# Patient Record
Sex: Female | Born: 1995 | Race: White | Hispanic: No | Marital: Single | Smoking: Current every day smoker
Health system: Southern US, Community
[De-identification: ages and names within clinical notes are randomized; demographics above are authoritative.]

## PROBLEM LIST (undated history)

## (undated) ENCOUNTER — Inpatient Hospital Stay (HOSPITAL_COMMUNITY): Payer: Self-pay

## (undated) DIAGNOSIS — F1910 Other psychoactive substance abuse, uncomplicated: Secondary | ICD-10-CM

## (undated) DIAGNOSIS — Z789 Other specified health status: Secondary | ICD-10-CM

## (undated) DIAGNOSIS — K8020 Calculus of gallbladder without cholecystitis without obstruction: Secondary | ICD-10-CM

---
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
## 2013-02-24 ENCOUNTER — Encounter (HOSPITAL_COMMUNITY): Payer: Self-pay | Admitting: *Deleted

## 2013-12-15 ENCOUNTER — Encounter (HOSPITAL_COMMUNITY): Payer: Self-pay | Admitting: *Deleted

## 2014-06-21 ENCOUNTER — Emergency Department (HOSPITAL_COMMUNITY): Payer: Self-pay

## 2014-06-21 ENCOUNTER — Emergency Department (HOSPITAL_COMMUNITY): Payer: Medicaid Other

## 2014-06-21 ENCOUNTER — Emergency Department (HOSPITAL_COMMUNITY)
Admission: EM | Admit: 2014-06-21 | Discharge: 2014-06-21 | Disposition: A | Discharge status: Home / Self Care | Payer: Self-pay | Attending: Emergency Medicine | Admitting: Emergency Medicine

## 2014-06-21 ENCOUNTER — Encounter (HOSPITAL_COMMUNITY): Payer: Self-pay | Admitting: Emergency Medicine

## 2014-06-21 DIAGNOSIS — R109 Unspecified abdominal pain: Secondary | ICD-10-CM

## 2014-06-21 DIAGNOSIS — R112 Nausea with vomiting, unspecified: Secondary | ICD-10-CM

## 2014-06-21 DIAGNOSIS — O99331 Smoking (tobacco) complicating pregnancy, first trimester: Secondary | ICD-10-CM | POA: Insufficient documentation

## 2014-06-21 DIAGNOSIS — R1013 Epigastric pain: Secondary | ICD-10-CM | POA: Insufficient documentation

## 2014-06-21 DIAGNOSIS — Z5900 Homelessness unspecified: Secondary | ICD-10-CM

## 2014-06-21 DIAGNOSIS — B9689 Other specified bacterial agents as the cause of diseases classified elsewhere: Secondary | ICD-10-CM

## 2014-06-21 DIAGNOSIS — Z3490 Encounter for supervision of normal pregnancy, unspecified, unspecified trimester: Secondary | ICD-10-CM

## 2014-06-21 DIAGNOSIS — R52 Pain, unspecified: Secondary | ICD-10-CM

## 2014-06-21 DIAGNOSIS — F1910 Other psychoactive substance abuse, uncomplicated: Secondary | ICD-10-CM

## 2014-06-21 DIAGNOSIS — Z3A01 Less than 8 weeks gestation of pregnancy: Secondary | ICD-10-CM | POA: Insufficient documentation

## 2014-06-21 DIAGNOSIS — L03012 Cellulitis of left finger: Secondary | ICD-10-CM

## 2014-06-21 DIAGNOSIS — O2391 Unspecified genitourinary tract infection in pregnancy, first trimester: Secondary | ICD-10-CM | POA: Insufficient documentation

## 2014-06-21 DIAGNOSIS — O99711 Diseases of the skin and subcutaneous tissue complicating pregnancy, first trimester: Secondary | ICD-10-CM | POA: Insufficient documentation

## 2014-06-21 DIAGNOSIS — O23511 Infections of cervix in pregnancy, first trimester: Secondary | ICD-10-CM | POA: Insufficient documentation

## 2014-06-21 DIAGNOSIS — O99611 Diseases of the digestive system complicating pregnancy, first trimester: Secondary | ICD-10-CM | POA: Insufficient documentation

## 2014-06-21 DIAGNOSIS — O9989 Other specified diseases and conditions complicating pregnancy, childbirth and the puerperium: Secondary | ICD-10-CM | POA: Insufficient documentation

## 2014-06-21 DIAGNOSIS — R000 Tachycardia, unspecified: Secondary | ICD-10-CM | POA: Insufficient documentation

## 2014-06-21 DIAGNOSIS — O210 Mild hyperemesis gravidarum: Secondary | ICD-10-CM | POA: Insufficient documentation

## 2014-06-21 DIAGNOSIS — K8050 Calculus of bile duct without cholangitis or cholecystitis without obstruction: Secondary | ICD-10-CM

## 2014-06-21 DIAGNOSIS — N72 Inflammatory disease of cervix uteri: Secondary | ICD-10-CM

## 2014-06-21 DIAGNOSIS — Z720 Tobacco use: Secondary | ICD-10-CM

## 2014-06-21 DIAGNOSIS — N760 Acute vaginitis: Secondary | ICD-10-CM

## 2014-06-21 DIAGNOSIS — F1990 Other psychoactive substance use, unspecified, uncomplicated: Secondary | ICD-10-CM

## 2014-06-21 DIAGNOSIS — F17210 Nicotine dependence, cigarettes, uncomplicated: Secondary | ICD-10-CM | POA: Insufficient documentation

## 2014-06-21 HISTORY — DX: Other psychoactive substance abuse, uncomplicated: F19.10

## 2014-06-21 LAB — COMPREHENSIVE METABOLIC PANEL
ALBUMIN: 4.3 g/dL (ref 3.5–5.0)
ALT: 37 U/L (ref 14–54)
ANION GAP: 11 (ref 5–15)
AST: 29 U/L (ref 15–41)
Alkaline Phosphatase: 47 U/L (ref 38–126)
BILIRUBIN TOTAL: 0.7 mg/dL (ref 0.3–1.2)
BUN: 6 mg/dL (ref 6–20)
CALCIUM: 9.8 mg/dL (ref 8.9–10.3)
CO2: 20 mmol/L — ABNORMAL LOW (ref 22–32)
Chloride: 103 mmol/L (ref 101–111)
Creatinine, Ser: 0.54 mg/dL (ref 0.44–1.00)
Glucose, Bld: 96 mg/dL (ref 70–99)
POTASSIUM: 3.7 mmol/L (ref 3.5–5.1)
SODIUM: 134 mmol/L — AB (ref 135–145)
TOTAL PROTEIN: 8.6 g/dL — AB (ref 6.5–8.1)

## 2014-06-21 LAB — CBC WITH DIFFERENTIAL/PLATELET
BASOS ABS: 0 10*3/uL (ref 0.0–0.1)
Basophils Relative: 0 % (ref 0–1)
EOS ABS: 0.1 10*3/uL (ref 0.0–0.7)
Eosinophils Relative: 1 % (ref 0–5)
HCT: 43.6 % (ref 36.0–46.0)
Hemoglobin: 15.4 g/dL — ABNORMAL HIGH (ref 12.0–15.0)
LYMPHS ABS: 3 10*3/uL (ref 0.7–4.0)
Lymphocytes Relative: 26 % (ref 12–46)
MCH: 30 pg (ref 26.0–34.0)
MCHC: 35.3 g/dL (ref 30.0–36.0)
MCV: 84.8 fL (ref 78.0–100.0)
Monocytes Absolute: 0.7 10*3/uL (ref 0.1–1.0)
Monocytes Relative: 6 % (ref 3–12)
NEUTROS PCT: 67 % (ref 43–77)
Neutro Abs: 7.6 10*3/uL (ref 1.7–7.7)
PLATELETS: 265 10*3/uL (ref 150–400)
RBC: 5.14 MIL/uL — AB (ref 3.87–5.11)
RDW: 13.4 % (ref 11.5–15.5)
WBC: 11.4 10*3/uL — AB (ref 4.0–10.5)

## 2014-06-21 LAB — URINALYSIS, ROUTINE W REFLEX MICROSCOPIC
Glucose, UA: NEGATIVE mg/dL
KETONES UR: 15 mg/dL — AB
NITRITE: POSITIVE — AB
PH: 7 (ref 5.0–8.0)
Protein, ur: 30 mg/dL — AB
SPECIFIC GRAVITY, URINE: 1.023 (ref 1.005–1.030)
Urobilinogen, UA: 1 mg/dL (ref 0.0–1.0)

## 2014-06-21 LAB — WET PREP, GENITAL
Trich, Wet Prep: NONE SEEN
YEAST WET PREP: NONE SEEN

## 2014-06-21 LAB — RAPID HIV SCREEN (HIV 1/2 AB+AG)
HIV 1/2 Antibodies: NONREACTIVE
HIV-1 P24 ANTIGEN - HIV24: NONREACTIVE

## 2014-06-21 LAB — URINE MICROSCOPIC-ADD ON

## 2014-06-21 LAB — HEPATITIS PANEL, ACUTE
HCV AB: REACTIVE — AB
HEP A IGM: NONREACTIVE
Hep B C IgM: NONREACTIVE
Hepatitis B Surface Ag: NEGATIVE

## 2014-06-21 LAB — I-STAT BETA HCG BLOOD, ED (MC, WL, AP ONLY): I-stat hCG, quantitative: >2000 m[IU]/mL — ABNORMAL HIGH (ref <5)

## 2014-06-21 LAB — HCG, QUANTITATIVE, PREGNANCY: hCG, Beta Chain, Quant, S: 36814 m[IU]/mL — ABNORMAL HIGH (ref <5)

## 2014-06-21 LAB — LIPASE, BLOOD: LIPASE: 88 U/L — AB (ref 22–51)

## 2014-06-21 MED ORDER — DEXTROSE 5 % IV SOLN
1.0000 g | Freq: Once | INTRAVENOUS | Status: AC
  Administered 2014-06-21: 1 g via INTRAVENOUS
  Filled 2014-06-21: qty 10

## 2014-06-21 MED ORDER — METRONIDAZOLE 500 MG PO TABS: 500.0000 mg | ORAL_TABLET | Freq: Two times a day (BID) | ORAL | Status: AC

## 2014-06-21 MED ORDER — MORPHINE SULFATE 4 MG/ML IJ SOLN
4.0000 mg | Freq: Once | INTRAMUSCULAR | Status: AC
  Administered 2014-06-21: 4 mg via INTRAVENOUS
  Filled 2014-06-21: qty 1

## 2014-06-21 MED ORDER — ONDANSETRON HCL 4 MG/2ML IJ SOLN
4.0000 mg | Freq: Once | INTRAMUSCULAR | Status: AC
  Administered 2014-06-21: 4 mg via INTRAVENOUS
  Filled 2014-06-21: qty 2

## 2014-06-21 MED ORDER — METRONIDAZOLE 500 MG PO TABS
500.0000 mg | ORAL_TABLET | Freq: Once | ORAL | Status: AC
  Administered 2014-06-21: 500 mg via ORAL
  Filled 2014-06-21: qty 1

## 2014-06-21 MED ORDER — PRENATAL COMPLETE 14-0.4 MG PO TABS: 1.0000 | ORAL_TABLET | Freq: Every day | ORAL | Status: AC

## 2014-06-21 MED ORDER — CEPHALEXIN 500 MG PO CAPS: 500.0000 mg | ORAL_CAPSULE | Freq: Four times a day (QID) | ORAL | Status: AC

## 2014-06-21 MED ORDER — AZITHROMYCIN 250 MG PO TABS
1000.0000 mg | ORAL_TABLET | Freq: Once | ORAL | Status: AC
  Administered 2014-06-21: 1000 mg via ORAL
  Filled 2014-06-21: qty 4

## 2014-06-21 MED ORDER — SODIUM CHLORIDE 0.9 % IV BOLUS (SEPSIS)
1000.0000 mL | Freq: Once | INTRAVENOUS | Status: AC
  Administered 2014-06-21: 1000 mL via INTRAVENOUS

## 2014-06-21 NOTE — ED Notes (Signed)
Number to call lab results to--- [phone]

## 2014-06-21 NOTE — ED Provider Notes (Signed)
CSN: [id number]     Arrival date & time 06/21/14  1206 History   First MD Initiated Contact with Patient 06/21/14 1211     Chief Complaint  Patient presents with  . Abdominal Pain     (Consider location/radiation/quality/duration/timing/severity/associated sxs/prior Treatment) HPI  Lynn Everett is a 19 y.o. female with past medical history significant for IV heroin and methamphetamine use complaining of bilious emesis 10 episodes onset this morning. She also reports 8 out of 10 bilateral upper quadrant abdominal pain also onset this morning. It is associated with low back pain. Patient denies fever, chills, chest pain, shortness of breath, headache, cervicalgia. She states that she had a good bowel movement this morning but that she's been constipated for 2 weeks. Patient was diagnosed with gallstones 2 years ago, states she does not typically get biliary colic.  Past Medical History  Diagnosis Date  . Gall stones   . Medical history non-contributory   . Substance abuse    History reviewed. No pertinent past surgical history. No family history on file. History  Substance Use Topics  . Smoking status: Current Every Day Smoker -- 1.00 packs/day    Types: Cigarettes  . Smokeless tobacco: Not on file  . Alcohol Use: No   OB History    Gravida Para Term Preterm AB TAB SAB Ectopic Multiple Living   1              Review of Systems  10 systems reviewed and found to be negative, except as noted in the HPI.  Allergies  Codeine  Home Medications   Prior to Admission medications   Medication Sig Start Date End Date Taking? Authorizing Provider  cephALEXin (KEFLEX) 500 MG capsule Take 1 capsule (500 mg total) by mouth 4 (four) times daily. 06/21/14   Lynn Sutcliffe, PA-C  metroNIDAZOLE (FLAGYL) 500 MG tablet Take 1 tablet (500 mg total) by mouth 2 (two) times daily. One tab PO bid x 10 days 06/21/14   Lynn EmeryNicole Christophr Calix, PA-C  Prenatal Vit-Fe Fumarate-FA (PRENATAL COMPLETE) 14-0.4 MG  TABS Take 1 tablet by mouth daily. 06/21/14   Lynn Fornwalt, PA-C   BP 105/51 mmHg  Pulse 108  Temp(Src) 97.7 F (36.5 C) (Oral)  Resp 19  Ht 5' (1.524 m)  Wt 155 lb (70.308 kg)  BMI 30.27 kg/m2  SpO2 100%  LMP 04/21/2014 Physical Exam  Constitutional: She is oriented to person, place, and time. She appears well-developed and well-nourished. No distress.  HENT:  Head: Normocephalic and atraumatic.  Mouth/Throat: Oropharynx is clear and moist.  Poor dentition  Eyes: Conjunctivae and EOM are normal. Pupils are equal, round, and reactive to light.  Neck: Normal range of motion. Neck supple.  FROM to C-spine. Pt can touch chin to chest without discomfort. No TTP of midline cervical spine.   Cardiovascular: Regular rhythm and intact distal pulses.   No murmur heard. Mildly tachycardic  Pulmonary/Chest: Effort normal. No stridor.  Abdominal: Soft. Bowel sounds are normal. She exhibits no distension and no mass. There is tenderness. There is no rebound and no guarding.  Mildly tender to palpation in the epigastrium, Murphy sign negative.  Musculoskeletal: Normal range of motion.  Point tenderness to percussion of thoracic or lumbar spine. No paraspinal tenderness to palpation or spasm.  Neurological: She is alert and oriented to person, place, and time.  Skin: Rash noted. She is not diaphoretic.  Excoriated lesions to bilateral upper extremities, lower extremities. Patient also has injection track marks at the  antecubital areas and on fingers.   Lynn Everett has erythematous and tender to senna meter area to the dorsum of the left small digit MCP. There is no focal fluctuance or discharge. Patient states that this is where she injected yesterday.  No Osler's nodes, Janeway lesion or splinter hemorrhages.  Psychiatric: She has a normal mood and affect.  Nursing note and vitals reviewed.   ED Course  Procedures (including critical care time) Labs Review Labs Reviewed  WET PREP, GENITAL -  Abnormal; Notable for the following:    Clue Cells Wet Prep HPF POC MANY (*)    WBC, Wet Prep HPF POC MANY (*)    All other components within normal limits  CBC WITH DIFFERENTIAL/PLATELET - Abnormal; Notable for the following:    WBC 11.4 (*)    RBC 5.14 (*)    Hemoglobin 15.4 (*)    All other components within normal limits  COMPREHENSIVE METABOLIC PANEL - Abnormal; Notable for the following:    Sodium 134 (*)    CO2 20 (*)    Total Protein 8.6 (*)    All other components within normal limits  LIPASE, BLOOD - Abnormal; Notable for the following:    Lipase 88 (*)    All other components within normal limits  URINALYSIS, ROUTINE W REFLEX MICROSCOPIC - Abnormal; Notable for the following:    APPearance TURBID (*)    Hgb urine dipstick SMALL (*)    Bilirubin Urine SMALL (*)    Ketones, ur 15 (*)    Protein, ur 30 (*)    Nitrite POSITIVE (*)    Leukocytes, UA MODERATE (*)    All other components within normal limits  URINE MICROSCOPIC-ADD ON - Abnormal; Notable for the following:    Squamous Epithelial / LPF MANY (*)    Bacteria, UA MANY (*)    All other components within normal limits  HCG, QUANTITATIVE, PREGNANCY - Abnormal; Notable for the following:    hCG, Beta Chain, Lynn Everett, [name] [id number] (*)    All other components within normal limits  I-STAT BETA HCG BLOOD, ED ([facility], [facility], [facility] ONLY) - Abnormal; Notable for the following:    I-stat hCG, quantitative >2000.0 (*)    All other components within normal limits  URINE CULTURE  RAPID HIV SCREEN (HIV 1/2 AB+AG)  RPR  HEPATITIS PANEL, ACUTE  GC/CHLAMYDIA PROBE AMP ([place])    Imaging Review [place] Ob Comp Less 14 Wks  06/21/2014   CLINICAL DATA:  Pregnancy and abdominal pain. Quantitative beta HCG 36,800  EXAM: OBSTETRIC <14 WK US AND TRANSVAGINAL OB US  TECHNIQUE: Both transabdominal and transvaginal ultrasound examinations were performed for complete evaluation of the gestation as well as the maternal uterus, adnexal regions, and pelvic  cul-de-sac. Transvaginal technique was performed to assess early pregnancy.  COMPARISON:  None.  FINDINGS: Intrauterine gestational sac: Single  Yolk sac:  Present  Embryo:  Present  Cardiac Activity: Present  Heart Rate: 122  bpm  CRL:  6  mm   6 w   3 d                  US EDC: 02/11/2015  Maternal uterus/adnexae: No subchorionic hemorrhage. Right ovary: 2.3 by 1.5 by 2.3 cm, appears normal. Left ovary: 2.4 by 1.9 by 1.9 cm, containing an 8 mm corpus luteum cyst.  No free pelvic fluid.  IMPRESSION: 1. Single living intrauterine pregnancy measuring at 6 weeks 3 days gestation. No specific complicating feature observed.   Electronically  Signed   By: Gaylyn Rong M.D.   On: 06/21/2014 16:28   US Ob Transvaginal  06/21/2014   : Please see accession # [id number] for report.   Electronically Signed   By: Gaylyn Rong M.D.   On: 06/21/2014 16:59   US Abdomen Limited Ruq  06/21/2014   CLINICAL DATA:  Right upper abdominal pain  EXAM: US ABDOMEN LIMITED - RIGHT UPPER QUADRANT  COMPARISON:  04/11/2012  FINDINGS: Gallbladder:  Multiple shadowing calculi throughout the lumen of the gallbladder, limiting visualization of the distal wall. No definite wall thickening or pericholecystic fluid. Sonographer reports no sonographic Murphy's sign.  Common bile duct:  Diameter: Proximally 7.8 mm, distally 9.8 mm  Liver:  No focal lesion identified. Within normal limits in parenchymal echogenicity.  IMPRESSION: 1. Cholelithiasis with mild dilatation of the common duct. Correlate with any clinical or laboratory evidence of obstruction.   Electronically Signed   By: Corlis Leak M.D.   On: 06/21/2014 14:40     EKG Interpretation None      MDM   Final diagnoses:  Pain  Non-intractable vomiting with nausea, vomiting of unspecified type  Pregnancy  IV drug user  Polysubstance abuse  Homeless  Cellulitis of finger of left hand  Tobacco use  Cervicitis  Bacterial vaginosis  Biliary colic    Filed Vitals:    06/21/14 1220 06/21/14 1349 06/21/14 1445 06/21/14 1718  BP: 133/78 121/64 112/62 105/51  Pulse: 106 100 97 108  Temp: 97.7 F (36.5 C)  97.7 F (36.5 C)   TempSrc: Oral  Oral   Resp: Height: 5' (1.524 m)     Weight: 155 lb (70.308 kg)     SpO2: 100% 100% 100% 100%    Medications  sodium chloride 0.9 % bolus 1,000 mL (1,000 mLs Intravenous New Bag/Given 06/21/14 1315)  ondansetron (ZOFRAN) injection 4 mg (4 mg Intravenous Given 06/21/14 1315)  morphine 4 MG/ML injection 4 mg (4 mg Intravenous Given 06/21/14 1357)  cefTRIAXone (ROCEPHIN) 1 g in dextrose 5 % 50 mL IVPB (0 g Intravenous Stopped 06/21/14 1703)  metroNIDAZOLE (FLAGYL) tablet 500 mg (500 mg Oral Given 06/21/14 1717)  azithromycin (ZITHROMAX) tablet 1,000 mg (1,000 mg Oral Given 06/21/14 1717)    Lynn Riffle is a pleasant 19 y.o. female presenting witepigastric abdominal pain and multiple episodes of bilious emesis. Abdominal exam is nonsurgical, Murphy sign negative, there is mild tenderness to palpation in the epigastrium. She has a history of gallstones. Patient is mildly tachycardic. She is afebrile. Patient is an IV drug abuser, at patient's request. Testing her for HIV, syphilis and I've added on hepatitis. Check basic blood work, urine and right upper quadrant abdominal ultrasound.  Patient found to be pregnant by i-STAT hCG, above the limits of detection at greater than 2000. Patient will receive a OB ultrasound. Urine is contaminated but looks infected, we'll try to get a clean sample for culture and start her on a gram of Rocephin.  Wet prep shows many white blood cells and many clue cells. On my pelvic exam there is no cervical motion tenderness or adnexal tenderness to suggest PID. Pelvic ultrasound shows single IUP at 6 weeks 3 days.  Extensive discussion with this patient about her pregnancy and IV drug use, tobacco abuse. She is homeless, social work has given her resources. We'll treat her cervicitis and  bacterial vaginosis, will cover her cellulitis at injection site and UTI with Keflex.  Eluation does not  show pathology that would require ongoing emergent intervention or inpatient treatment. Pt is hemodynamically stable and mentating appropriately. Discussed findings and plan with patient/guardian, who agrees with care plan. All questions answered. Return precautions discussed and outpatient follow up given.   New Prescriptions   CEPHALEXIN (KEFLEX) 500 MG CAPSULE    Take 1 capsule (500 mg total) by mouth 4 (four) times daily.   METRONIDAZOLE (FLAGYL) 500 MG TABLET    Take 1 tablet (500 mg total) by mouth 2 (two) times daily. One tab PO bid x 10 days   PRENATAL VIT-FE FUMARATE-FA (PRENATAL COMPLETE) 14-0.4 MG TABS    Take 1 tablet by mouth daily.         Lynn Emery, PA-C 06/21/14 1459  Lynn Emery, PA-C 06/21/14 1730  Vanetta Mulders, MD 06/25/14 [phone]

## 2014-06-21 NOTE — ED Notes (Addendum)
Pt started vomiting early this am "green liquid" --- unable to eat anything since-- no diarrhea-- last BM this am,  states has been constipated for 2 weeks. Dx with gallstones 2 years ago with pregnancy-- has not had gallbladder out.   Pt admits to crystal meth last night ("a wash") last night, has used Opana and crystal 3 weeks ago-- pt has track marks on arms, multiple sores over body, arms and legs from using crystal. States this does not feel like withdrawals.

## 2014-06-21 NOTE — ED Notes (Signed)
Left little finger PIP joint red and swollen-- pt states tried to use a vein to shoot drugs last night, states feels like it is broken.

## 2014-06-21 NOTE — Discharge Instructions (Signed)
Follow with OB/GYN as soon as possible.   Do NOT take any NSAIDs, such as Aspirin, Motrin, Ibuprofen, Aleve, Naproxen etc. Only take Tylenol for pain. Return to the emergency room  for any severe abdominal pain, increasing vaginal bleeding, passing out or repeated vomiting.   Take your antibiotics as directed and to completion. You should never have any leftover antibiotics! Push fluids and stay well hydrated.   Do not hesitate to return to the emergency room for any new, worsening or concerning symptoms.  Please obtain primary care using resource guide below. Let them know that you were seen in the emergency room and that they will need to obtain records for further outpatient management.    Emergency Department Resource Guide 1) Find a Doctor and Pay Out of Pocket Although you won't have to find out who is covered by your insurance plan, it is a good idea to ask around and get recommendations. You will then need to call the office and see if the doctor you have chosen will accept you as a new patient and what types of options they offer for patients who are self-pay. Some doctors offer discounts or will set up payment plans for their patients who do not have insurance, but you will need to ask so you aren't surprised when you get to your appointment.  2) Contact Your Local Health Department Not all health departments have doctors that can see patients for sick visits, but many do, so it is worth a call to see if yours does. If you don't know where your local health department is, you can check in your phone book. The CDC also has a tool to help you locate your state's health department, and many state websites also have listings of all of their local health departments.  3) Find a Walk-in Clinic If your illness is not likely to be very severe or complicated, you may want to try a walk in clinic. These are popping up all over the country in pharmacies, drugstores, and shopping centers. They're  usually staffed by nurse practitioners or physician assistants that have been trained to treat common illnesses and complaints. They're usually fairly quick and inexpensive. However, if you have serious medical issues or chronic medical problems, these are probably not your best option.  No Primary Care Doctor: - Call Health Connect at  [phone] - they can help you locate a primary care doctor that  accepts your insurance, provides certain services, etc. - Physician Referral Service- [phone]  Chronic Pain Problems: Organization         Address  Phone   Notes  [facility]  [phone] Patients need to be referred by their primary care doctor.   Medication Assistance: Organization         Address  Phone   Notes  [facility] Medication [facility] [address] [place] [place], [place] [address] [phone] --Must be a resident of [facility] -- Must have NO insurance coverage whatsoever (no Medicaid/ Medicare, etc.) -- The pt. MUST have a primary care doctor that directs their care regularly and follows them in the community   MedAssist  [phone]   [employer]  [phone]    Agencies that provide inexpensive medical care: Organization         Address  Phone   Notes  [facility]  [phone]   [facility]    [phone]   Women's  [facility] [address] [place], [place] [address] [phone]   [facility] [address] [place] [phone]   Planned Parenthood    [phone]   [facility]    [phone]   [facility] and [facility]  [address], [place] Phone:  [phone], Fax:  [phone] Hours of Operation:  9 am - 6 pm, M-F.  Also accepts Medicaid/Medicare and self-pay.  [facility]  [address], [place] Phone: [phone], Fax: [phone]. Hours  of Operation:  8:30 am - 5:30 pm, M-F.  Also accepts Medicaid and self-pay.  [facility] [place] [address], [place] Phone: [phone]   [facility] [address] Natasha Bence [place], [place] [phone], Ext. [phone] Mondays & Thursdays: 7-9 AM.  First 15 patients are seen on a first come, first serve basis.    Medicaid-accepting [facility] Providers:  Organization         Address  Phone   Notes  [facility] [address], [place] [phone] Also accepts self-pay patients.  [facility] [address] Laurell Josephs [place], [place]  [phone]   [facility] [address], [place] [phone]   [facility] [address] [place] [phone]   Renaye Rakers [address], [place]   [phone] Only accepts [place] patients after they have their name applied to their card.   Self-Pay (no insurance) in [facility]:  Organization         Address  Phone   Notes  Sickle Cell Patients, [facility] [address] [place], [place] [phone]   [facility] Urgent Care [address] [place], [place] [phone]   [facility] [place]  [address], [place] [phone]   [facility]/Dr. Osei-Bonsu  [address] [place] or [address], [place] [phone] Phone number for both [place] and [place] locations is the same.  [facility] [address] [place] [phone]   [facility] [address] [place] or [address] [phone]   [facility] [address], [place] [phone], phone; [phone], fax Sees patients 1st and 3rd Saturday of every month.  Must not qualify for public or private insurance (i.e. Medicaid,  Medicare, [place] Health Choice, Veterans' Benefits)  Household income should be no more than 200% of the poverty level The clinic cannot treat you if you are pregnant or think you are pregnant  Sexually transmitted diseases are not treated at the clinic.    Dental Care: Organization         Address  Phone  Notes  [facility] Department of [facility] [address] [place] [address] [place], [place] [phone] Accepts children up to age 48 who are enrolled in [place] or [place] Health Choice; pregnant women with a Medicaid card; and children who have applied for Medicaid or [place] Health Choice, but were declined, whose parents can pay a reduced fee at time of service.  [facility] Department of [facility]  [address], [place] [phone] Accepts children up to age 61 who are  enrolled in Medicaid or [place] Health Choice; pregnant women with a Medicaid card; and children who have applied for Medicaid or [place] Health Choice, but were declined, whose parents can pay a reduced fee at time of service.  [facility]  [address], [place] [phone] Patients are seen by appointment only. Walk-ins are not accepted. [facility] will see patients 21 years of age and older. Monday - Tuesday (8am-5pm) Most Wednesdays (8:30-5pm) $30 per visit, cash only  [facility]  [address], [facility] [phone] Patients are seen by appointment only. Walk-ins are not accepted. [facility] will see patients 21 years of age and older. One Wednesday Evening (Monthly: Volunteer Based).  $30 per visit, cash only  [employer] [facility]  [phone] for adults; Children under age 844, call Graduate Pediatric Dentistry at [phone]. Children aged 224-14, please call [phone] to request a pediatric application.  Dental services are provided in all areas of dental care including fillings, crowns  and bridges, complete and partial dentures, implants, gum treatment, root canals, and extractions. Preventive care is also provided. Treatment is provided to both adults and children. Patients are selected via a lottery and there is often a waiting list.   [facility] [address], [place]  [phone] [email or web address]   [facility] [address], [place], [place] [phone], Ext. [phone] Second and Fourth Thursday of each month, opens at 6:30 AM; Clinic ends at 9 AM.  Patients are seen on a first-come first-served basis, and a limited number are seen during each clinic.   [facility]  [address], [place], [place] [phone]) [phone]   Eligibility Requirements You must have lived in [place], [place], or [place] counties for at least the last three months.   You cannot be eligible for state or federal sponsored [place] Cityhealthcare insurance, including CIGNAVeterans Administration, [place]icaid, or [employer]icare.   You generally cannot be eligible for healthcare insurance through your employer.    How to apply: Eligibility screenings are held every Tuesday and Wednesday afternoon from 1:00 pm until 4:00 pm. You do not need an appointment for the interview!  [facility] [address], [place], [place] [phone]   [facility]  [phone]   [facility]  [phone]   [facility]  [phone]    Behavioral Health Resources in the Community: Intensive Outpatient Programs Organization         Address  Phone  Notes  [facility] [address], [place], [place] [phone]   [facility] [address], [place], [place] [phone]   [facility] [address], [place], [place]  [phone]   [facility] [address],  [place], [place] [phone] or [phone]   Substance Abuse  Resources Organization         Address  Phone  Notes  [facility]  [phone]   [facility]  [phone]   [facility]  [phone]   [facility]  [phone]   Residential & Outpatient Substance Abuse Program  [phone]   Psychological Services Organization         Address  Phone  Notes  [facility]  [phone]- [phone]   [facility]  [phone]- [phone]   [facility] [address], [place]  [phone] or [phone]    Mobile Crisis Teams Organization         Address  Phone  Notes  [facility], Mobile Crisis Care Unit  [phone]   [facility]  [address] [place], [place] [phone]   [facility] [address] [place] [phone]    Self-Help/Support Groups Organization         Address  Phone             Notes  [facility]ental [facility] Assoc[facility] of [place] - variety of support groups  [phone] Call for more information  Narcotics Anonymous (NA), [facility] [address], [place]  2 meetings at this location   Statisticianesidential Treatment Programs Organization         Address  Phone  Notes  [facility] [address],    [place]  [phone]   [facility]  [address], [place] [phone], [place], [place] [phone]   [facility] [address], [place] [phone] Admissions: 8am-3pm M-F  [facility] [address]    [place], [place] [phone]   [facility] [address], [place], [place] [phone]   [facility] [address]  [place], [place] [phone]   [facility] - Intensive Outpatient [address], [place], [place] [phone]   [facility] ([facility] [address]  [place], [place] [phone] or [phone]   [facility] ([facility]) [address] [place], [place] [phone] Accepts Medicaid  [facility] [address]  [place] [phone] Substance Abuse/Addiction Treatment   [facility] Resources Organization         Address  Phone  Notes  [facility]  [phone](888) [phone]   Angie FavaJulie Brannon, PhD [address], [address] [place], [place]   [phone] or [phone]) [phone]   [facility]    [address] [place], [place] [phone]   [facility] [address], [place], [place] [phone]) [phone] Insurance/Medicaid/sponsorship through [facility]  [facility] Families [address]                                    [place], [place] [phone] Therapy/tele-psych/case  [facility] [address]   [place], [place] [phone]) [phone]    Dr. Lolly MustacheArfeen  [phone]   [facility] [place]  [employer] [facility] 1) [address], [place] 2) [address], [place] 3)  [address], [place] [phone]) [phone](336) [phone]  [phone](336) [phone]   [facility] Child Abuse Hotline [phone](336) [phone] or [phone]) [phone] (After Hours)

## 2014-06-21 NOTE — Progress Notes (Signed)
CSW met with this 19 y/o patient and her significant other bedside and discussed SA resources as well as homeless resources.  CSW gave the couple bus passes offered encouragement and support.  [facility] Josaiah Muhammed Richardo Priest ED CSW [phone]

## 2014-06-21 NOTE — ED Notes (Signed)
Pt continues to c/o pain in left hand/little finger-- boyfriend at bedside-- pt is homeless, has been "sleeping wherever" -- staying in "boss man's car, motels if we have money" requesting information on shelters, possible treatment centers.

## 2014-06-21 NOTE — ED Notes (Signed)
Patient returned back from Ultrasound and placed back on monitor.

## 2014-06-21 NOTE — ED Notes (Addendum)
Patient transported to Ultrasound 

## 2014-06-22 ENCOUNTER — Telehealth (HOSPITAL_COMMUNITY): Payer: Self-pay

## 2014-06-22 LAB — GC/CHLAMYDIA PROBE AMP (~~LOC~~) NOT AT ARMC
Chlamydia: NEGATIVE
NEISSERIA GONORRHEA: NEGATIVE

## 2014-06-22 LAB — RPR: RPR Ser Ql: NONREACTIVE

## 2014-06-22 NOTE — ED Notes (Signed)
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

## 2014-06-23 ENCOUNTER — Telehealth (HOSPITAL_BASED_OUTPATIENT_CLINIC_OR_DEPARTMENT_OTHER): Payer: Self-pay | Admitting: Emergency Medicine

## 2014-06-23 LAB — URINE CULTURE: SPECIAL REQUESTS: NORMAL

## 2014-06-24 ENCOUNTER — Telehealth (HOSPITAL_BASED_OUTPATIENT_CLINIC_OR_DEPARTMENT_OTHER): Payer: Self-pay | Admitting: Emergency Medicine

## 2014-06-24 NOTE — Telephone Encounter (Signed)
Post ED Visit - Positive Culture Follow-up  Culture report reviewed by antimicrobial stewardship pharmacist: []  Wes Dulaney, Pharm.D., BCPS [x]  Celedonio MiyamotoJeremy Frens, Pharm.D., BCPS []  Georgina PillionElizabeth Martin, Pharm.D., BCPS []  LondonMinh Pham, [address] BoulevardPharm.D., BCPS, AAHIVP []  Estella HuskMichelle Turner, Pharm.D., BCPS, AAHIVP []  Elder CyphersLorie Poole, [address] BoulevardPharm.D., BCPS  Positive urine culture E. Coli Treated with metronidazole and cephalexin, organism sensitive to the same and no further patient follow-up is required at this time.  Berle MullMiller, Saarah Dewing 06/24/2014, 10:43 AM

## 2014-06-25 ENCOUNTER — Telehealth (HOSPITAL_BASED_OUTPATIENT_CLINIC_OR_DEPARTMENT_OTHER): Payer: Self-pay | Admitting: Emergency Medicine

## 2014-07-10 ENCOUNTER — Telehealth (HOSPITAL_BASED_OUTPATIENT_CLINIC_OR_DEPARTMENT_OTHER): Payer: Self-pay | Admitting: Emergency Medicine

## 2014-07-10 NOTE — Telephone Encounter (Signed)
Letter returned, no known forwarding address, lost to followup 

## 2014-08-15 ENCOUNTER — Other Ambulatory Visit: Payer: Self-pay

## 2014-08-17 ENCOUNTER — Telehealth (HOSPITAL_COMMUNITY): Payer: Self-pay

## 2014-08-17 NOTE — ED Notes (Signed)
Unable to contact pt by mail or telephone. Unable to communicate lab results or treatment changes. 

## 2016-05-15 IMAGING — US US ABDOMEN LIMITED
1 series · 14 of 25 positions shown · non-contrast
Comparison: 04/11/2012

CLINICAL DATA: Right upper abdominal pain

EXAM:
US ABDOMEN LIMITED - RIGHT UPPER QUADRANT

[Series 1: us abdomen limited · 0.24mm/px · 14 of 42 slices shown]
[im 1/42]
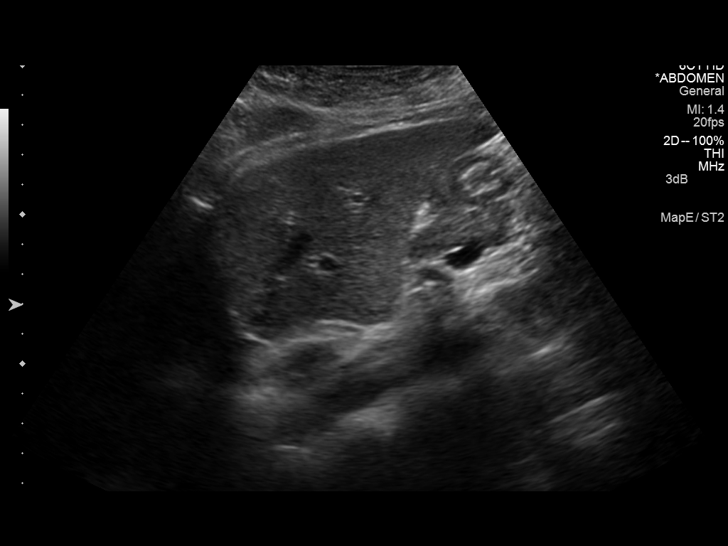
[im 4/42]
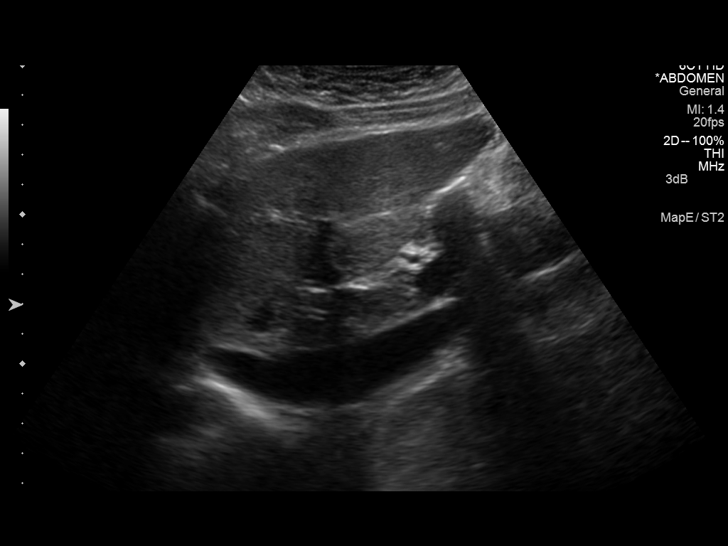
[im 7/42]
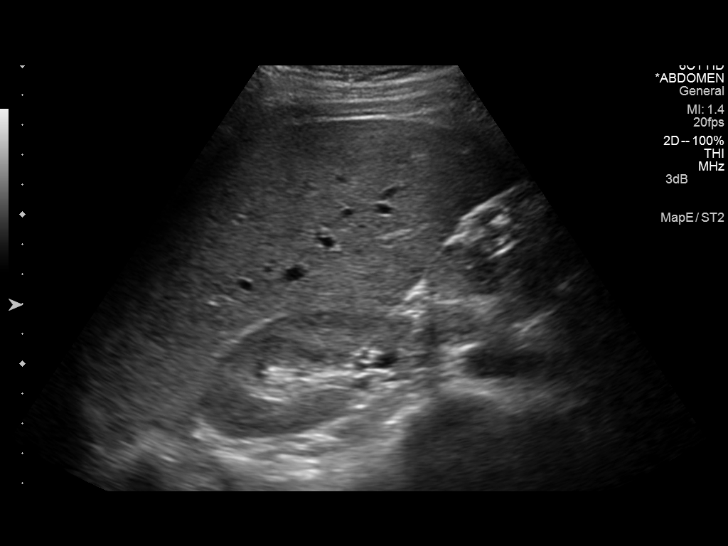
[im 11/42]
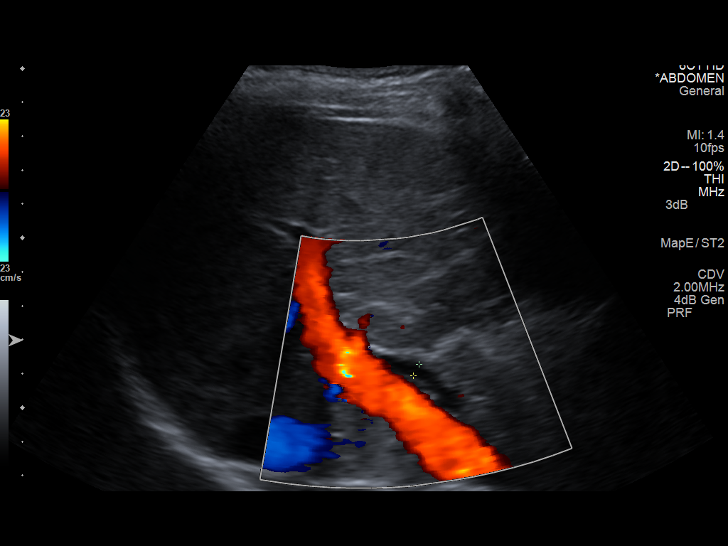
[im 14/42]
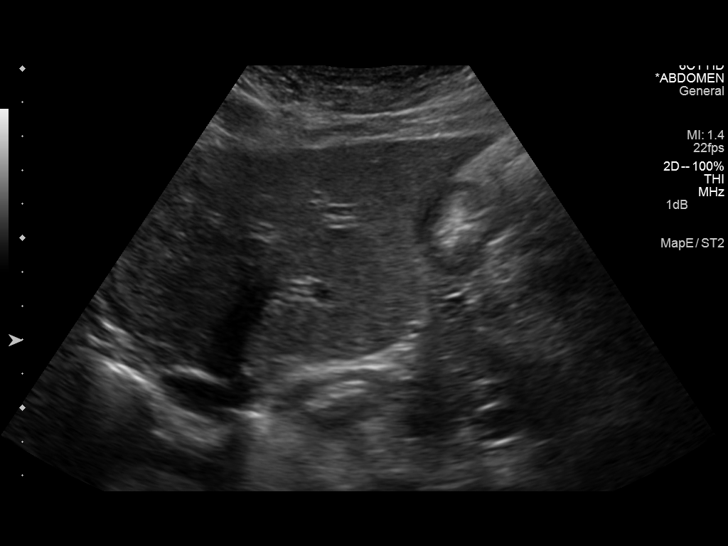
[im 16/42]
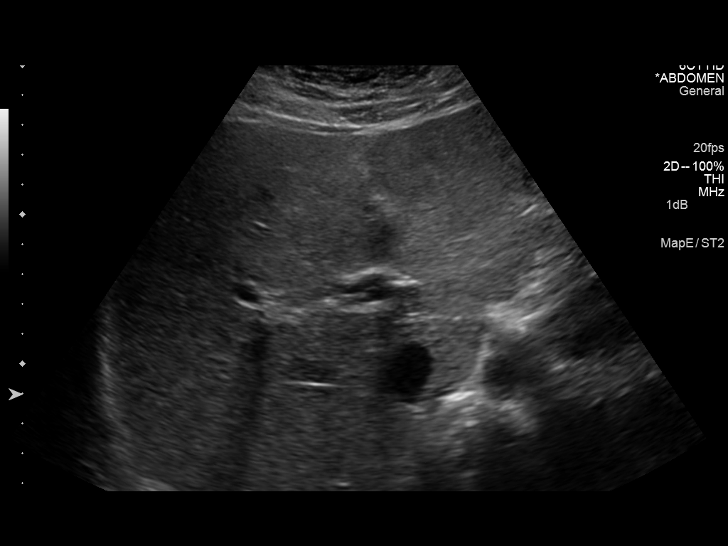
[im 19/42]
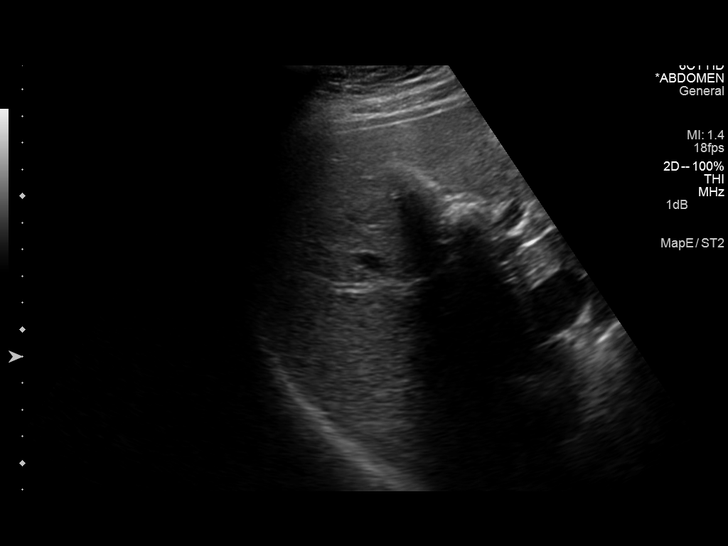
[im 23/42]
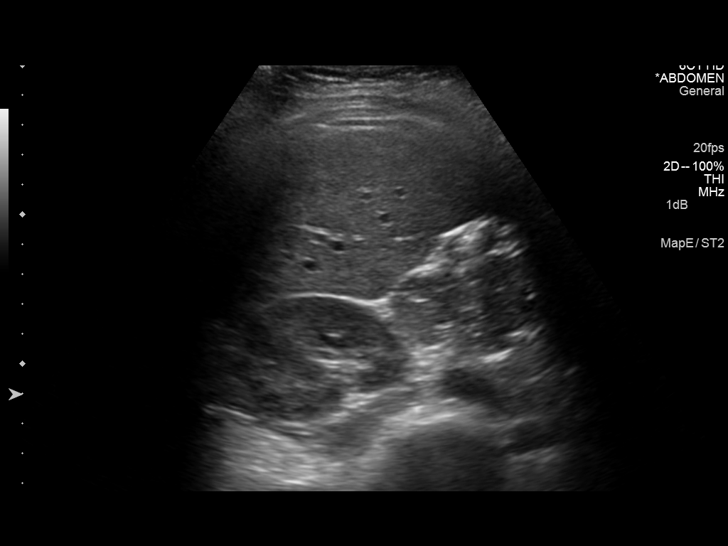
[im 26/42]
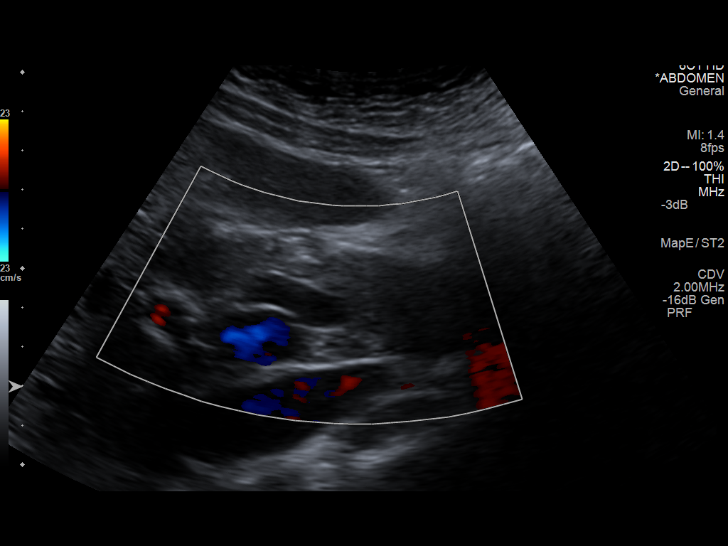
[im 28/42]
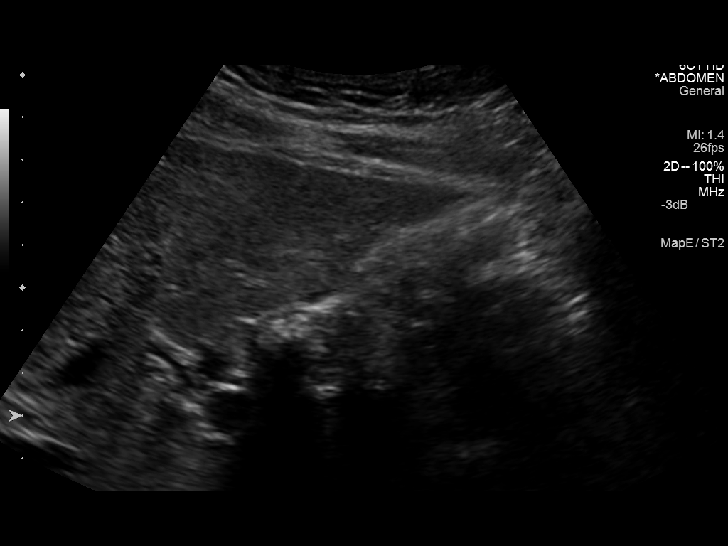
[im 31/42]
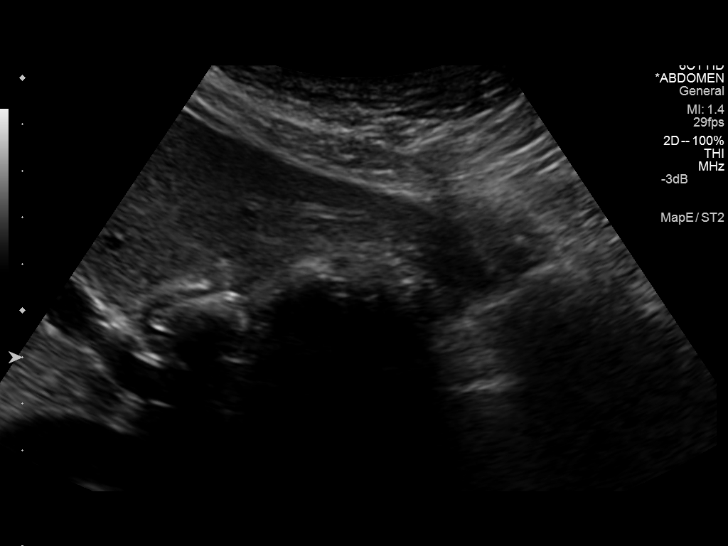
[im 35/42]
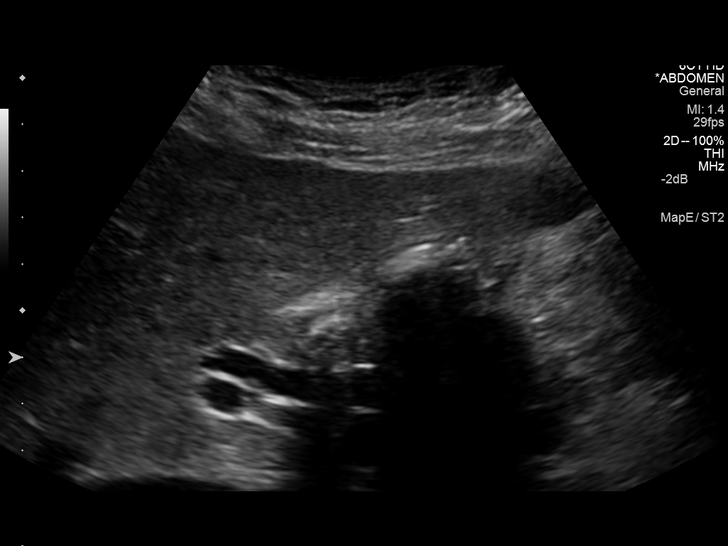
[im 38/42]
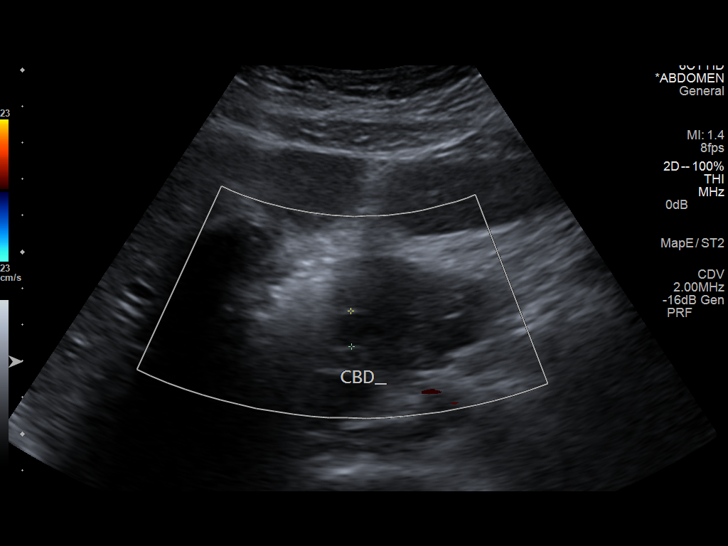
[im 42/42]
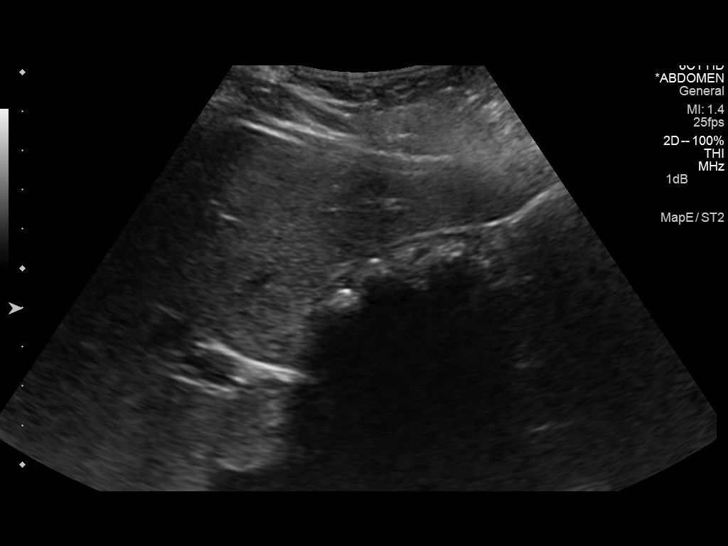

[14 of 25 positions shown; findings below may reference images not displayed]

FINDINGS: Gallbladder:

Multiple shadowing calculi throughout the lumen of the gallbladder,
limiting visualization of the distal wall. No definite wall
thickening or pericholecystic fluid. Sonographer reports no
sonographic Murphy's sign.

Common bile duct:

Diameter: Proximally 7.8 mm, distally 9.8 mm

Liver:

No focal lesion identified. Within normal limits in parenchymal
echogenicity.
IMPRESSION: 1. Cholelithiasis with mild dilatation of the common duct. Correlate
with any clinical or laboratory evidence of obstruction.

## 2017-06-13 DEATH — deceased
